# Patient Record
Sex: Male | Born: 1985 | Race: Black or African American | Hispanic: No | Marital: Single | Smoking: Current every day smoker
Health system: Southern US, Community
[De-identification: ages and names within clinical notes are randomized; demographics above are authoritative.]

## PROBLEM LIST (undated history)

## (undated) DIAGNOSIS — F32A Depression, unspecified: Secondary | ICD-10-CM

## (undated) HISTORY — DX: Depression, unspecified: F32.A

---

## 2011-03-23 ENCOUNTER — Emergency Department: Payer: Self-pay | Admitting: Emergency Medicine

## 2012-02-07 ENCOUNTER — Emergency Department: Payer: Self-pay | Admitting: Emergency Medicine

## 2013-10-14 ENCOUNTER — Emergency Department: Payer: Self-pay | Admitting: Emergency Medicine

## 2013-10-14 LAB — URINALYSIS, COMPLETE
Bacteria: NONE SEEN
Bilirubin,UR: NEGATIVE
Glucose,UR: NEGATIVE mg/dL (ref 0–75)
Ketone: NEGATIVE
Leukocyte Esterase: NEGATIVE
Nitrite: NEGATIVE
Ph: 7 (ref 4.5–8.0)
Squamous Epithelial: 1
WBC UR: 8 /HPF (ref 0–5)

## 2013-10-14 LAB — BASIC METABOLIC PANEL
BUN: 11 mg/dL (ref 7–18)
Calcium, Total: 9.1 mg/dL (ref 8.5–10.1)
Chloride: 103 mmol/L (ref 98–107)
Co2: 25 mmol/L (ref 21–32)
EGFR (African American): >60
EGFR (Non-African Amer.): >60
Glucose: 107 mg/dL — ABNORMAL HIGH (ref 65–99)
Osmolality: 270 (ref 275–301)
Potassium: 4.1 mmol/L (ref 3.5–5.1)
Sodium: 135 mmol/L — ABNORMAL LOW (ref 136–145)

## 2013-10-14 LAB — CBC
MCH: 29.8 pg (ref 26.0–34.0)
MCHC: 33.9 g/dL (ref 32.0–36.0)
Platelet: 258 10*3/uL (ref 150–440)
RBC: 5.19 10*6/uL (ref 4.40–5.90)
RDW: 13 % (ref 11.5–14.5)
WBC: 13.3 10*3/uL — ABNORMAL HIGH (ref 3.8–10.6)

## 2013-10-17 ENCOUNTER — Emergency Department: Payer: Self-pay | Admitting: Emergency Medicine

## 2014-06-21 LAB — CBC WITH DIFFERENTIAL/PLATELET
BASOS ABS: 0 10*3/uL (ref 0.0–0.1)
BASOS PCT: 0.3 %
Eosinophil #: 0.2 10*3/uL (ref 0.0–0.7)
Eosinophil %: 1.1 %
HCT: 44.2 % (ref 40.0–52.0)
HGB: 14.3 g/dL (ref 13.0–18.0)
LYMPHS ABS: 2.8 10*3/uL (ref 1.0–3.6)
Lymphocyte %: 18.9 %
MCH: 28.4 pg (ref 26.0–34.0)
MCHC: 32.4 g/dL (ref 32.0–36.0)
MCV: 88 fL (ref 80–100)
MONO ABS: 1.2 x10 3/mm — AB (ref 0.2–1.0)
MONOS PCT: 7.9 %
Neutrophil #: 10.8 10*3/uL — ABNORMAL HIGH (ref 1.4–6.5)
Neutrophil %: 71.8 %
PLATELETS: 245 10*3/uL (ref 150–440)
RBC: 5.03 10*6/uL (ref 4.40–5.90)
RDW: 12.7 % (ref 11.5–14.5)
WBC: 15.1 10*3/uL — AB (ref 3.8–10.6)

## 2014-06-21 LAB — BASIC METABOLIC PANEL
ANION GAP: 6 — AB (ref 7–16)
BUN: 6 mg/dL — AB (ref 7–18)
CALCIUM: 8.8 mg/dL (ref 8.5–10.1)
CHLORIDE: 101 mmol/L (ref 98–107)
Co2: 28 mmol/L (ref 21–32)
Creatinine: 0.98 mg/dL (ref 0.60–1.30)
EGFR (African American): >60
Glucose: 93 mg/dL (ref 65–99)
Osmolality: 267 (ref 275–301)
POTASSIUM: 3.5 mmol/L (ref 3.5–5.1)
Sodium: 135 mmol/L — ABNORMAL LOW (ref 136–145)

## 2014-06-22 ENCOUNTER — Inpatient Hospital Stay: Payer: Self-pay | Admitting: Internal Medicine

## 2014-06-22 LAB — VANCOMYCIN, TROUGH: VANCOMYCIN, TROUGH: 7 ug/mL — AB (ref 10–20)

## 2014-06-23 LAB — BASIC METABOLIC PANEL
ANION GAP: 4 — AB (ref 7–16)
BUN: 5 mg/dL — ABNORMAL LOW (ref 7–18)
CHLORIDE: 105 mmol/L (ref 98–107)
CO2: 28 mmol/L (ref 21–32)
Calcium, Total: 8.8 mg/dL (ref 8.5–10.1)
Creatinine: 0.77 mg/dL (ref 0.60–1.30)
EGFR (Non-African Amer.): >60
Glucose: 91 mg/dL (ref 65–99)
OSMOLALITY: 271 (ref 275–301)
Potassium: 4 mmol/L (ref 3.5–5.1)
Sodium: 137 mmol/L (ref 136–145)

## 2014-06-23 LAB — CBC WITH DIFFERENTIAL/PLATELET
BASOS PCT: 0.4 %
Basophil #: 0.1 10*3/uL (ref 0.0–0.1)
EOS ABS: 0.3 10*3/uL (ref 0.0–0.7)
Eosinophil %: 2.3 %
HCT: 40.4 % (ref 40.0–52.0)
HGB: 13.6 g/dL (ref 13.0–18.0)
LYMPHS PCT: 22.6 %
Lymphocyte #: 2.7 10*3/uL (ref 1.0–3.6)
MCH: 29.7 pg (ref 26.0–34.0)
MCHC: 33.5 g/dL (ref 32.0–36.0)
MCV: 89 fL (ref 80–100)
MONOS PCT: 8.1 %
Monocyte #: 1 x10 3/mm (ref 0.2–1.0)
NEUTROS PCT: 66.6 %
Neutrophil #: 8 10*3/uL — ABNORMAL HIGH (ref 1.4–6.5)
Platelet: 261 10*3/uL (ref 150–440)
RBC: 4.56 10*6/uL (ref 4.40–5.90)
RDW: 12.5 % (ref 11.5–14.5)
WBC: 12 10*3/uL — ABNORMAL HIGH (ref 3.8–10.6)

## 2014-06-26 LAB — CULTURE, BLOOD (SINGLE)

## 2015-04-21 NOTE — H&P (Signed)
PATIENT NAME:  Manuel Curtis, LASKI MR#:  [id number] DATE OF BIRTH:  16-Oct-1986  DATE OF ADMISSION:  06/22/2014  REFERRING PHYSICIAN:  Enedina Finner. Manson Passey, MD.  PRIMARY CARE PHYSICIAN:  None.   CHIEF COMPLAINTS:  Left facial swelling, erythema, and tenderness.   HISTORY OF PRESENT ILLNESS:  This is a 29 year old male without significant past medical history who presents with above-mentioned complaints. Reports over the last 48 hours initially he had an ingrown hair which he pulled, but then reports that the area started to become infected, more swollen, tender, and red which prompted him to come to ED today. Upon presentation to ED, the patient had fever at 99.7, was tachycardic at 106, and had leukocytosis at 15,000. The patient had CT neck with contrast, which she did show symmetric swelling with extensive inflammatory stranding throughout the left neck compatible with cellulitis and ill-defined phlegmonous changes within the subcutaneous fat of the left face without frank abscess. Given these symptoms, the patient was requested to admit to the hospitalist service. The patient denies any dysphagia, any problem with swallowing, any problem with speech.   PAST MEDICAL HISTORY:  None.   PAST SURGICAL HISTORY:  None.   SOCIAL HISTORY:  The patient smokes 6 cigarettes per day. No alcohol. No illicit drug use.   FAMILY HISTORY: Denies any family history of hypertension or diabetes.   ALLERGIES: NO KNOWN DRUG ALLERGIES.   HOME MEDICATIONS:  None.   REVIEW OF SYSTEMS:  CONSTITUTIONAL:  Reports mild fever. Denies weakness, weight gain, weight loss.  EYES:  Denies blurry vision, double vision, inflammation, glaucoma.  ENT:  Denies tinnitus, ear pain, hearing loss, epistaxis or discharge.  RESPIRATORY:  Denies cough, wheezing, hemoptysis.  CARDIOVASCULAR:  Denies chest pain, edema, palpitation.  GASTROINTESTINAL:  Denies nausea, vomiting, diarrhea, abdominal pain.  GENITOURINARY:  Denies dysuria,  hematuria.  ENDOCRINE:  Denies polyuria, polydipsia, heat or cold intolerance.  HEMATOLOGY:  Denies anemia, easy bruising, bleeding, diathesis.  INTEGUMENT:  Denies acne, rash, or skin issues.  MUSCULOSKELETAL:  Denies any swelling, gout, cramps, arthritis.  NEUROLOGIC:  Denies tremors, vertigo, ataxia, dementia.  PSYCHIATRIC:  Denies anxiety, insomnia, or depression.   PHYSICAL EXAMINATION:  VITAL SIGNS:  Temperature 99.7, pulse 106, respiratory rate 18, blood pressure 140/72, saturating 97% on room air.  GENERAL:  Well-nourished young male, looks comfortable, in no apparent distress.  HEENT:  The patient has left facial swelling, erythema, and tenderness with lymphadenopathy. Throat has no erythema noticed in the back of the throat. NECK:  As mentioned above. No carotid bruits. Trachea is midline.  CHEST:  Good air entry bilaterally. No wheezing, rales, rhonchi.  CARDIOVASCULAR:  S1, S2 heard. No rubs, murmurs, or gallops.  ABDOMEN:  Soft, nontender, nondistended. Bowel sounds present.  EXTREMITIES:  No edema. No clubbing. No cyanosis. Pedal and radial pulses felt bilaterally.  PSYCHIATRIC:  Appropriate affect. Awake, alert x3. Intact judgment and insight.  NEUROLOGIC:  Cranial nerves grossly intact. Motor 5/5. No focal deficits.  MUSCULOSKELETAL:  No joint effusion or erythema.   PERTINENT LABORATORY DATA: Glucose 93, BUN 6, creatinine 0.98, sodium 135, potassium 3.5, chloride 101, CO2 of 28. White blood cell 15.1, hemoglobin repeat 14.3, hematocrit 44.2, platelets 245,000.   IMAGING STUDIES: CT neck with IV contrast showing symmetric swelling with extensive inflammatory stranding throughout the left neck, most compatible with cellulitis and ill-defined phlegmonous changes within the subcutaneous fat of the left face without frank abscess. No (Dictation Anomaly) MISSING TEXT definite drainable fluid collection identified. (Dictation Anomaly)  MISSING TEXT level 1 and 2 adenopathy, likely  reactive in nature and (Dictation Anomaly) MISSING TEXT maxillary sinus disease.   ASSESSMENT AND PLAN:  1. Sepsis: The patient is septic as he is tachycardic, febrile with leukocytosis. His source is most likely his face/neck cellulitis. The patient will be started on IV antibiotics, vancomycin and Zosyn. We will follow on the blood cultures.  2. Tobacco abuse: The patient was counseled; at this point does not want a nicotine patch.  3. Deep vein thrombosis prophylaxis: Subcutaneous heparin.  4. Code status: Full code.   Total time spent on admission and patient care 45 minutes.     ____________________________ Starleen Armsawood S. Elgergawy, MD [phone] D: 06/22/2014 02:29:25 ET T: 06/22/2014 08:14:36 ET JOB#: [id number]  cc: Starleen Armsawood S. Elgergawy, MD, <Dictator> DAWOOD Teena IraniS ELGERGAWY MD ELECTRONICALLY SIGNED 06/23/2014 20:32

## 2015-04-21 NOTE — Discharge Summary (Signed)
PATIENT NAME:  Manuel Curtis, Manuel Curtis MR#:  [id number] DATE OF BIRTH:  1986/11/08  DATE OF ADMISSION:  06/22/2014 DATE OF DISCHARGE:  06/23/2014  ADMISSION DIAGNOSIS: Facial cellulitis.   DISCHARGE DIAGNOSIS: Facial cellulitis.   IMAGING: The patient had CT scan which showed no evidence of abscess, but he does have facial cellulitis.  Blood cultures negative to date.   White blood cells 12, hemoglobin 13.6, hematocrit 41, platelets are 261,000.   Sodium 137, potassium 4.0, chloride 105, bicarbonate 28, BUN 5, creatinine 0.77, glucose 91.   CONSULTATIONS: None.   HOSPITAL COURSE: A 29 year old male who presented with facial cellulitis. further details, please refer to the H and P.   1.  Facial cellulitis. The patient had a CT scan in the ER, which showed no evidence of abscess. He had a small little boil, probably from ingrown hair, the etiology of the cellulitis. This actually was draining.  Cellulitis has improved.  No evidence of any abscess, and patient was on broad-spectrum antibiotics and will be discharged with Bactrim.   2.  Tobacco dependence. The patient was encouraged to stop smoking and he was counseled.   DISCHARGE MEDICATIONS: Bactrim 1 tablet p.o. b.i.d. x 8 days.   DISCHARGE DIET: Regular.   DISCHARGE ACTIVITY: As tolerated.   DISCHARGE FOLLOW UP: The patient was referred to [facility].   TIME SPENT: 35 minutes.   The patient is stable for discharge.    ____________________________ Sital P. Juliene PinaMody, MD spm:ts D: 06/23/2014 13:25:26 ET T: 06/23/2014 18:39:59 ET JOB#: [id number]  cc: Sital P. Juliene PinaMody, MD, <Dictator> [facility] SITAL P MODY MD ELECTRONICALLY SIGNED 06/24/2014 11:49

## 2015-06-05 ENCOUNTER — Encounter: Payer: Self-pay | Admitting: *Deleted

## 2015-06-05 ENCOUNTER — Emergency Department
Admission: EM | Admit: 2015-06-05 | Discharge: 2015-06-05 | Disposition: A | Discharge status: Home / Self Care | Payer: Self-pay | Attending: Emergency Medicine | Admitting: Emergency Medicine

## 2015-06-05 DIAGNOSIS — Y9289 Other specified places as the place of occurrence of the external cause: Secondary | ICD-10-CM | POA: Insufficient documentation

## 2015-06-05 DIAGNOSIS — Z720 Tobacco use: Secondary | ICD-10-CM | POA: Insufficient documentation

## 2015-06-05 DIAGNOSIS — Y998 Other external cause status: Secondary | ICD-10-CM | POA: Insufficient documentation

## 2015-06-05 DIAGNOSIS — S0501XA Injury of conjunctiva and corneal abrasion without foreign body, right eye, initial encounter: Secondary | ICD-10-CM

## 2015-06-05 DIAGNOSIS — Y9389 Activity, other specified: Secondary | ICD-10-CM | POA: Insufficient documentation

## 2015-06-05 DIAGNOSIS — X58XXXA Exposure to other specified factors, initial encounter: Secondary | ICD-10-CM | POA: Insufficient documentation

## 2015-06-05 MED ORDER — ERYTHROMYCIN 5 MG/GM OP OINT: 1.0000 "application " | TOPICAL_OINTMENT | Freq: Four times a day (QID) | OPHTHALMIC | Status: DC

## 2015-06-05 MED ORDER — TETRACAINE HCL 0.5 % OP SOLN
OPHTHALMIC | Status: AC
  Administered 2015-06-05: 2 [drp] via OPHTHALMIC
  Filled 2015-06-05: qty 2

## 2015-06-05 MED ORDER — FLUORESCEIN SODIUM 1 MG OP STRP
ORAL_STRIP | OPHTHALMIC | Status: AC
  Administered 2015-06-05: 1 via OPHTHALMIC
  Filled 2015-06-05: qty 1

## 2015-06-05 MED ORDER — TETRACAINE HCL 0.5 % OP SOLN
2.0000 [drp] | Freq: Once | OPHTHALMIC | Status: AC
  Administered 2015-06-05: 2 [drp] via OPHTHALMIC

## 2015-06-05 MED ORDER — FLUORESCEIN SODIUM 1 MG OP STRP
1.0000 | ORAL_STRIP | Freq: Once | OPHTHALMIC | Status: AC
  Administered 2015-06-05: 1 via OPHTHALMIC

## 2015-06-05 NOTE — ED Provider Notes (Signed)
[facility] Emergency Department Provider Note  ____________________________________________  Time seen: Approximately 1:31 PM  I have reviewed the triage vital signs and the nursing notes.   HISTORY  Chief Complaint Conjunctivitis   HPI Manuel Curtis is a 29 y.o. male presents to the ER for a complaint of right eye redness. Patient denies pain but states is mildly irritated feeling. Patient states that his right eye was somewhat red Friday when he got up and then noticed that it gradually progressed as the day went on Friday. Denies vision changes, states vision in right eye is normal for him without his contacts. Patient states that he normally wears contacts in both eyes but does not have contact in right eye since redness has started. Patient states that when he put his contact in his right eye Friday morning he felt like he may have scratched his right eye. States intermittently feels like something is in his eye. Denies left eye complaints.   Denies foreign bodies, chemicals, pinkeye exposure or other contacts. Denies other complaints.   History reviewed. No pertinent past medical history.  There are no active problems to display for this patient.   History reviewed. No pertinent past surgical history.  No current outpatient prescriptions on file.  Allergies Review of patient's allergies indicates no known allergies.  History reviewed. No pertinent family history.  Social History History  Substance Use Topics  . Smoking status: Current Every Day Smoker -- 0.50 packs/day  . Smokeless tobacco: Not on file  . Alcohol Use: No    Review of Systems Constitutional: No fever/chills Eyes: Right eye redness. ENT: No sore throat. Cardiovascular: Denies chest pain. Respiratory: Denies shortness of breath. Gastrointestinal: No abdominal pain.  No nausea, no vomiting.  No diarrhea.  No constipation. Genitourinary: Negative for  dysuria. Musculoskeletal: Negative for back pain. Skin: Negative for rash. Neurological: Negative for headaches, focal weakness or numbness.  10-point ROS otherwise negative.  ____________________________________________   PHYSICAL EXAM:  VITAL SIGNS: ED Triage Vitals  Enc Vitals Group     BP 06/05/15 1247 112/70 mmHg     Pulse Rate 06/05/15 1247 70     Resp 06/05/15 1247 20     Temp 06/05/15 1247 98.3 F (36.8 C)     Temp Source 06/05/15 1247 Oral     SpO2 06/05/15 1247 98 %     Weight 06/05/15 1247 160 lb (72.576 kg)     Height 06/05/15 1247 6' (1.829 m)     Head Cir --      Peak Flow --      Pain Score --      Pain Loc --      Pain Edu? --      Excl. in [place]? --     Constitutional: Alert and oriented. Well appearing and in no acute distress. Eyes: Left Conjunctiva normal. Right eye with mild to mod injection. Bedside posterior segments appear normal. Right eye examined with anesthesia use with tetracaine 2 drops. Fluorescein dye also use. Very small right corneal abrasion found at approximately 5:00 o'clock. No other abnormalities noted. PERRL. EOMI. No pain with EOMs.  Head: Atraumatic. Ears: no erythema, normal TMs Nose: No congestion/rhinnorhea. Mouth/Throat: Mucous membranes are moist.  Oropharynx non-erythematous. Neck: No stridor.  No cervical spine tenderness to palpation. Hematological/Lymphatic/Immunilogical: No cervical lymphadenopathy. Cardiovascular: Normal rate, regular rhythm. Grossly normal heart sounds.  Good peripheral circulation. Respiratory: Normal respiratory effort.  No retractions. Lungs CTAB. Gastrointestinal: Soft and nontender. No distention.  Musculoskeletal: No lower extremity tenderness nor edema.  No joint effusions. Neurologic:  Normal speech and language. No gross focal neurologic deficits are appreciated. Speech is normal. No gait instability. Skin:  Skin is warm, dry and intact. No rash noted. Psychiatric: Mood and affect are normal.  Speech and behavior are normal.   PROCEDURES  Procedure(s) performed:  Right eye examined with Wood's lamp. Anesthesia with 2 drops of tetracaine. Small corneal abrasion at 5:00 found. Patient tolerated well. ____________________________________________   INITIAL IMPRESSION / ASSESSMENT AND PLAN / ED COURSE  Pertinent labs & imaging results that were available during my care of the patient were reviewed by me and considered in my medical decision making (see chart for details).  No acute distress. Very well-appearing patient. Presents to the ER for complaint of 3-4 days of right eye redness. Denies pain. Denies vision changes. States he believes he may have scratched his eye when placed in an contacts. Has not used contacts since. Small right corneal abrasion noted on exam. Will treat patient with erythromycin ointment and follow up with ophthalmology. Discussed do not use contacts until fully resolved. Discussed strict follow-up and return parameters. Patient agreed to plan. ____________________________________________   FINAL CLINICAL IMPRESSION(S) / ED DIAGNOSES  Final diagnoses:  Corneal abrasion, right, initial encounter      Renford Dills, NP 06/05/15 1433  Myrna Blazer, MD 06/05/15 [phone]

## 2015-06-05 NOTE — ED Notes (Signed)
Pt reports right eye redness with itching starting Friday

## 2015-06-05 NOTE — Discharge Instructions (Signed)
Use medication as prescribed. No contacts in right eye until completed treatment.  Follow-up with ophthalmology this week as discussed. See above.  Return to ER for new or worsening concerns.  Corneal Abrasion The cornea is the clear covering at the front and center of the eye. When looking at the colored portion of the eye (iris), you are looking through the cornea. This very thin tissue is made up of many layers. The surface layer is a single layer of cells (corneal epithelium) and is one of the most sensitive tissues in the body. If a scratch or injury causes the corneal epithelium to come off, it is called a corneal abrasion. If the injury extends to the tissues below the epithelium, the condition is called a corneal ulcer. CAUSES   Scratches.  Trauma.  Foreign body in the eye. Some people have recurrences of abrasions in the area of the original injury even after it has healed (recurrent erosion syndrome). Recurrent erosion syndrome generally improves and goes away with time. SYMPTOMS   Eye pain.  Difficulty or inability to keep the injured eye open.  The eye becomes very sensitive to light.  Recurrent erosions tend to happen suddenly, first thing in the morning, usually after waking up and opening the eye. DIAGNOSIS  Your health care provider can diagnose a corneal abrasion during an eye exam. Dye is usually placed in the eye using a drop or a small paper strip moistened by your tears. When the eye is examined with a special light, the abrasion shows up clearly because of the dye. TREATMENT   Small abrasions may be treated with antibiotic drops or ointment alone.  A pressure patch may be put over the eye. If this is done, follow your doctor's instructions for when to remove the patch. Do not drive or use machines while the eye patch is on. Judging distances is hard to do with a patch on. If the abrasion becomes infected and spreads to the deeper tissues of the cornea, a corneal  ulcer can result. This is serious because it can cause corneal scarring. Corneal scars interfere with light passing through the cornea and cause a loss of vision in the involved eye. HOME CARE INSTRUCTIONS  Use medicine or ointment as directed. Only take over-the-counter or prescription medicines for pain, discomfort, or fever as directed by your health care provider.  Do not drive or operate machinery if your eye is patched. Your ability to judge distances is impaired.  If your health care provider has given you a follow-up appointment, it is very important to keep that appointment. Not keeping the appointment could result in a severe eye infection or permanent loss of vision. If there is any problem keeping the appointment, let your health care provider know. SEEK MEDICAL CARE IF:   You have pain, light sensitivity, and a scratchy feeling in one eye or both eyes.  Your pressure patch keeps loosening up, and you can blink your eye under the patch after treatment.  Any kind of discharge develops from the eye after treatment or if the lids stick together in the morning.  You have the same symptoms in the morning as you did with the original abrasion days, weeks, or months after the abrasion healed. MAKE SURE YOU:   Understand these instructions.  Will watch your condition.  Will get help right away if you are not doing well or get worse. Document Released: 12/12/2000 Document Revised: 12/20/2013 Document Reviewed: 08/22/2013 [facility] Patient Information 2015 [place], [place].  This information is not intended to replace advice given to you by your health care provider. Make sure you discuss any questions you have with your health care provider. ° °

## 2019-10-23 ENCOUNTER — Encounter: Payer: Self-pay | Admitting: Emergency Medicine

## 2019-10-23 ENCOUNTER — Emergency Department
Admission: EM | Admit: 2019-10-23 | Discharge: 2019-10-24 | Disposition: A | Discharge status: Home / Self Care | Payer: No Typology Code available for payment source | Attending: Emergency Medicine | Admitting: Emergency Medicine

## 2019-10-23 ENCOUNTER — Other Ambulatory Visit: Payer: Self-pay

## 2019-10-23 ENCOUNTER — Emergency Department: Payer: No Typology Code available for payment source

## 2019-10-23 DIAGNOSIS — Y9389 Activity, other specified: Secondary | ICD-10-CM | POA: Diagnosis not present

## 2019-10-23 DIAGNOSIS — Y999 Unspecified external cause status: Secondary | ICD-10-CM | POA: Insufficient documentation

## 2019-10-23 DIAGNOSIS — Y92410 Unspecified street and highway as the place of occurrence of the external cause: Secondary | ICD-10-CM | POA: Insufficient documentation

## 2019-10-23 DIAGNOSIS — M546 Pain in thoracic spine: Secondary | ICD-10-CM | POA: Insufficient documentation

## 2019-10-23 DIAGNOSIS — M542 Cervicalgia: Secondary | ICD-10-CM | POA: Insufficient documentation

## 2019-10-23 DIAGNOSIS — R0789 Other chest pain: Secondary | ICD-10-CM | POA: Insufficient documentation

## 2019-10-23 DIAGNOSIS — F17210 Nicotine dependence, cigarettes, uncomplicated: Secondary | ICD-10-CM | POA: Insufficient documentation

## 2019-10-23 DIAGNOSIS — M25511 Pain in right shoulder: Secondary | ICD-10-CM | POA: Diagnosis not present

## 2019-10-23 DIAGNOSIS — M25562 Pain in left knee: Secondary | ICD-10-CM | POA: Diagnosis not present
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
## 2019-10-24 ENCOUNTER — Emergency Department: Payer: No Typology Code available for payment source
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
## 2022-03-04 ENCOUNTER — Encounter: Payer: Self-pay | Admitting: Family Medicine

## 2022-03-04 ENCOUNTER — Ambulatory Visit: Payer: Self-pay | Admitting: Family Medicine

## 2022-03-04 ENCOUNTER — Other Ambulatory Visit: Payer: Self-pay

## 2022-03-04 DIAGNOSIS — Z113 Encounter for screening for infections with a predominantly sexual mode of transmission: Secondary | ICD-10-CM

## 2022-03-04 DIAGNOSIS — Z202 Contact with and (suspected) exposure to infections with a predominantly sexual mode of transmission: Secondary | ICD-10-CM

## 2022-03-04 MED ORDER — METRONIDAZOLE 500 MG PO TABS: 500.0000 mg | ORAL_TABLET | Freq: Two times a day (BID) | ORAL | 0 refills | Status: AC

## 2022-03-04 NOTE — Progress Notes (Signed)
[facility] Department ?STI clinic/screening visit ? ?Subjective:  ?Manuel Curtis is a 36 y.o. male being seen today for an STI screening visit. The patient reports they do not have symptoms.   ? ?Patient has the following medical conditions:  There are no problems to display for this patient. ? ? ? ?Chief Complaint  ?Patient presents with  ? Exposure to STD  ?  screening  ? ? ?HPI ? ?Patient reports here for screening  ? ?Does the patient or their partner desires a pregnancy in the next year? No ? ?Screening for MPX risk: ?Does the patient have an unexplained rash? No ?Is the patient MSM? No ?Does the patient endorse multiple sex partners or anonymous sex partners? Yes ?Did the patient have close or sexual contact with a person diagnosed with MPX? No ?Has the patient traveled outside the [place] where MPX is endemic? No ?Is there a high clinical suspicion for MPX-- evidenced by one of the following No ? -Unlikely to be chickenpox ? -Lymphadenopathy ? -Rash that present in same phase of evolution on any given body part ? ? ?See flowsheet for further details and programmatic requirements.  ? ? ?The following portions of the patient's history were reviewed and updated as appropriate: allergies, current medications, past medical history, past social history, past surgical history and problem list. ? ?Objective:  ?There were no vitals filed for this visit. ? ?Physical Exam ?Constitutional:   ?   Appearance: Normal appearance.  ?HENT:  ?   Head: Normocephalic.  ?   Mouth/Throat:  ?   Mouth: Mucous membranes are moist.  ?   Pharynx: Oropharynx is clear. No oropharyngeal exudate.  ?Pulmonary:  ?   Effort: Pulmonary effort is normal.  ?Genitourinary: ?   Comments: Deferred, pt declined  ?Musculoskeletal:  ?   Cervical back: Normal range of motion and neck supple.  ?Lymphadenopathy:  ?   Cervical: No cervical adenopathy.  ?Skin: ?   General: Skin is warm and dry.  ?   Findings: No bruising, erythema, lesion or rash.   ?Neurological:  ?   Mental Status: He is alert and oriented to person, place, and time.  ?Psychiatric:     ?   Mood and Affect: Mood normal.     ?   Behavior: Behavior normal.  ? ? ? ? ?Assessment and Plan:  ?Manuel Curtis is a 36 y.o. male presenting to the [facility] Department for STI screening ? ?1. Screening examination for venereal disease ?Patient does not have STI symptoms ?Patient declined all screenings including  gram stain, oral, urine, rectal CT/GC and bloodwork for HIV/RPR.  ?Patient meets criteria for HepB screening? Yes. Ordered? No - declined ?Patient meets criteria for HepC screening? Yes. Ordered? No - declined ?Recommended condom use with all sex ?Discussed importance of condom use for STI prevent ? ?Treat gram stain per standing order ?Discussed time line for State Lab results and that patient will be called with positive results and encouraged patient to call if he had not heard in 2 weeks ?Recommended returning for continued or worsening symptoms.  ? ?2. Exposure to trichomonas ?Pt treated for exposure  ?- metroNIDAZOLE (FLAGYL) 500 MG tablet; Take 1 tablet (500 mg total) by mouth 2 (two) times daily for 7 days.  Dispense: 14 tablet; Refill: 0 ? ? ? ? ?No follow-ups on file. ? ?Future Appointments  ?Date Time Provider Department Center  ?03/12/2022 10:00 AM Jerrol Banana, MD [facility]  ? ? ?  Wendi Snipes, FNP ?

## 2022-03-04 NOTE — Progress Notes (Signed)
Patient treated per SO as contact to Trich.Jenetta Downer, RN  ?

## 2022-03-12 ENCOUNTER — Ambulatory Visit (INDEPENDENT_AMBULATORY_CARE_PROVIDER_SITE_OTHER): Payer: Self-pay | Admitting: Family Medicine

## 2022-03-12 ENCOUNTER — Encounter: Payer: Self-pay | Admitting: Family Medicine

## 2022-03-12 ENCOUNTER — Other Ambulatory Visit: Payer: Self-pay

## 2022-03-12 VITALS — BP 124/82 | HR 71 | Ht 74.0 in | Wt 170.8 lb

## 2022-03-12 DIAGNOSIS — F329 Major depressive disorder, single episode, unspecified: Secondary | ICD-10-CM

## 2022-03-12 MED ORDER — HYDROXYZINE PAMOATE 25 MG PO CAPS: 25.0000 mg | ORAL_CAPSULE | Freq: Four times a day (QID) | ORAL | 1 refills | Status: DC | PRN

## 2022-03-12 MED ORDER — SERTRALINE HCL 50 MG PO TABS: 50.0000 mg | ORAL_TABLET | Freq: Every day | ORAL | 3 refills | Status: DC

## 2022-03-12 NOTE — Patient Instructions (Signed)
-   Start sertraline daily ?- Can dose hydroxyzine every 6 hours as-needed for anxiety episodes ?- Review information provided ?- Return in 4 weeks for physical ?

## 2022-03-14 DIAGNOSIS — F329 Major depressive disorder, single episode, unspecified: Secondary | ICD-10-CM | POA: Insufficient documentation

## 2022-03-14 NOTE — Assessment & Plan Note (Signed)
Patient with several week history of severe depression and anxiety in the setting of life stressors (relationship issues).  He has been attempting regular exercise with modest benefit, has noted sleep disruption, denies any SI or HI. ? ?We discussed both pharmacologic and nonpharmacologic treatment strategies, is amenable to sertraline 50 mg daily, plan for close follow-up to reassess response and need for further titration. ?

## 2022-03-14 NOTE — Progress Notes (Signed)
?  ? ?  Primary Care / Sports Medicine Office Visit ? ?Patient Information:  ?Patient ID: Manuel Curtis, male DOB: 1986/03/25 Age: 36 y.o. MRN: [id number]  ? ?Manuel Curtis is a pleasant 36 y.o. male presenting with the following: ? ?Chief Complaint  ?Patient presents with  ? Establish Care  ?  Depression/Anxiety talk  ? ? ?Vitals:  ? 03/12/22 1007  ?BP: 124/82  ?Pulse: 71  ?SpO2: 99%  ? ?Vitals:  ? 03/12/22 1007  ?Weight: 170 lb 12.8 oz (77.5 kg)  ?Height: 6\' 2"  (1.88 m)  ? ?Body mass index is 21.93 kg/m?. ? ?No results found.  ? ?Independent interpretation of notes and tests performed by another provider:  ? ?None ? ?Procedures performed:  ? ?None ? ?Pertinent History, Exam, Impression, and Recommendations:  ? ?Current episode of major depressive disorder without prior episode ?Patient with several week history of severe depression and anxiety in the setting of life stressors (relationship issues).  He has been attempting regular exercise with modest benefit, has noted sleep disruption, denies any SI or HI. ? ?We discussed both pharmacologic and nonpharmacologic treatment strategies, is amenable to sertraline 50 mg daily, plan for close follow-up to reassess response and need for further titration.  ? ?Orders & Medications ?Meds ordered this encounter  ?Medications  ? sertraline (ZOLOFT) 50 MG tablet  ?  Sig: Take 1 tablet (50 mg total) by mouth daily.  ?  Dispense:  30 tablet  ?  Refill:  3  ? hydrOXYzine (VISTARIL) 25 MG capsule  ?  Sig: Take 1 capsule (25 mg total) by mouth every 6 (six) hours as needed for anxiety.  ?  Dispense:  30 capsule  ?  Refill:  1  ? ?No orders of the defined types were placed in this encounter. ?  ? ?Return in about 4 weeks (around 04/09/2022) for Physical.  ?  ? ?06/09/2022, MD ? ? Primary Care Sports Medicine ?[facility] ?[facility]  ? ?

## 2022-04-09 ENCOUNTER — Ambulatory Visit (INDEPENDENT_AMBULATORY_CARE_PROVIDER_SITE_OTHER): Payer: Self-pay | Admitting: Family Medicine

## 2022-04-09 ENCOUNTER — Encounter: Payer: Self-pay | Admitting: Family Medicine

## 2022-04-09 VITALS — BP 118/70 | HR 76 | Ht 72.0 in | Wt 174.4 lb

## 2022-04-09 DIAGNOSIS — Z0000 Encounter for general adult medical examination without abnormal findings: Secondary | ICD-10-CM

## 2022-04-09 DIAGNOSIS — Z1159 Encounter for screening for other viral diseases: Secondary | ICD-10-CM

## 2022-04-09 DIAGNOSIS — Z13220 Encounter for screening for lipoid disorders: Secondary | ICD-10-CM

## 2022-04-09 DIAGNOSIS — Z114 Encounter for screening for human immunodeficiency virus [HIV]: Secondary | ICD-10-CM

## 2022-04-09 DIAGNOSIS — R7989 Other specified abnormal findings of blood chemistry: Secondary | ICD-10-CM

## 2022-04-09 DIAGNOSIS — F329 Major depressive disorder, single episode, unspecified: Secondary | ICD-10-CM

## 2022-04-09 MED ORDER — SERTRALINE HCL 50 MG PO TABS: 50.0000 mg | ORAL_TABLET | Freq: Every day | ORAL | 3 refills | Status: DC

## 2022-04-09 NOTE — Assessment & Plan Note (Signed)
Patient reports subjective interval improvement, did not initiate medication as he found nonpharmacologic methods to improve his wellbeing.  That being said, he continues to report symptoms, racing thoughts, anxiety episodes, is amenable to both starting sertraline as previously prescribed as well as establishing with psychiatry.  A referral was placed in that regard. ?

## 2022-04-09 NOTE — Assessment & Plan Note (Signed)
Annual examination completed, risk stratification labs ordered, anticipatory guidance provided.  We will follow labs once resulted.  Patient due for Tdap, deferred. ?

## 2022-04-09 NOTE — Patient Instructions (Signed)
-   Obtain fasting labs with orders provided (can have water or black coffee but otherwise no food or drink x 8 hours before labs) °- Review information provided °- Attend eye doctor annually, dentist every 6 months, work towards or maintain 30 minutes of moderate intensity physical activity at least 5 days per week, and consume a balanced diet °- Return in 1 year for physical °- Contact us for any questions between now and then °

## 2022-04-09 NOTE — Progress Notes (Signed)
?  ? ?Annual Physical Exam Visit ? ?Patient Information:  ?Patient ID: Manuel Curtis, male DOB: 1986-06-29 Age: 36 y.o. MRN: [id number]  ? ?Subjective:  ? ?CC: Annual Physical Exam ? ?HPI:  ?Manuel Curtis is here for their annual physical. ? ?I reviewed the past medical history, family history, social history, surgical history, and allergies today and changes were made as necessary.  Please see the problem list section below for additional details. ? ?Past Medical History: ?Past Medical History:  ?Diagnosis Date  ? Depression   ? ?Past Surgical History: ?History reviewed. No pertinent surgical history. ?Family History: ?Family History  ?Problem Relation Age of Onset  ? Hypertension Mother   ? Heart disease Mother   ? Diabetes Mother   ? Cancer Father   ? COPD Paternal Grandmother   ? ?Allergies: ?No Known Allergies ?Health Maintenance: ?Health Maintenance  ?Topic Date Due  ? Hepatitis C Screening  Never done  ? TETANUS/TDAP  03/13/2023 (Originally 07/20/2006)  ? INFLUENZA VACCINE  07/29/2022  ? HIV Screening  Completed  ? HPV VACCINES  Aged Out  ? COVID-19 Vaccine  Discontinued  ?  ?HM Colonoscopy   ? ? This patient has no relevant Health Maintenance data.  ? ?  ? ?Medications: ?No current outpatient medications on file prior to visit.  ? ?No current facility-administered medications on file prior to visit.  ? ? ?Review of Systems: No headache, visual changes, nausea, vomiting, diarrhea, constipation, dizziness, abdominal pain, skin rash, fevers, chills, night sweats, swollen lymph nodes, weight loss, chest pain, body aches, joint swelling, muscle aches, shortness of breath, mood changes, visual or auditory hallucinations reported. ? ?Objective:  ? ?Vitals:  ? 04/09/22 1048  ?BP: 118/70  ?Pulse: 76  ?SpO2: 98%  ? ?Vitals:  ? 04/09/22 1048  ?Weight: 174 lb 6.4 oz (79.1 kg)  ?Height: 6' (1.829 m)  ? ?Body mass index is 23.65 kg/m?. ? ?General: Well Developed, well nourished, and in no acute distress.  ?Neuro:  Alert and oriented x3, extra-ocular muscles intact, sensation grossly intact. Cranial nerves II through XII are grossly intact, motor, sensory, and coordinative functions are intact. ?HEENT: Normocephalic, atraumatic, pupils equal round reactive to light, neck supple, no masses, no lymphadenopathy, thyroid nonpalpable. Oropharynx, nasopharynx, external ear canals are unremarkable. ?Skin: Warm and dry, no rashes noted.  ?Cardiac: Regular rate and rhythm, no murmurs rubs or gallops. No peripheral edema. Pulses symmetric. ?Respiratory: Clear to auscultation bilaterally. Not using accessory muscles, speaking in full sentences.  ?Abdominal: Soft, nontender, nondistended, positive bowel sounds, no masses, no organomegaly. ?Musculoskeletal: Shoulder, elbow, wrist, hip, knee, ankle stable, and with full range of motion. ? ?Impression and Recommendations:  ? ?The patient was counselled, risk factors were discussed, and anticipatory guidance given. ? ?Annual physical exam ?Annual examination completed, risk stratification labs ordered, anticipatory guidance provided.  We will follow labs once resulted.  Patient due for Tdap, deferred. ? ?Current episode of major depressive disorder without prior episode ?Patient reports subjective interval improvement, did not initiate medication as he found nonpharmacologic methods to improve his wellbeing.  That being said, he continues to report symptoms, racing thoughts, anxiety episodes, is amenable to both starting sertraline as previously prescribed as well as establishing with psychiatry.  A referral was placed in that regard. ? ?Orders & Medications ?Medications:  ?Meds ordered this encounter  ?Medications  ? sertraline (ZOLOFT) 50 MG tablet  ?  Sig: Take 1 tablet (50 mg total) by mouth daily.  ?  Dispense:  30 tablet  ?  Refill:  3  ? ?Orders Placed This Encounter  ?Procedures  ? Apo A1 + B + Ratio  ? CBC  ? Comprehensive metabolic panel  ? Hepatitis C antibody  ? VITAMIN D 25  Hydroxy (Vit-D Deficiency, Fractures)  ? TSH  ? Lipid panel  ? HIV Antibody (routine testing w rflx)  ? Ambulatory referral to Psychiatry  ?  ? ?Return in about 1 year (around 04/10/2023) for Annual Physical.  ? ? ?Jerrol Banana, MD ? ? Primary Care Sports Medicine ?[facility] ?[facility]  ? ?

## 2022-05-23 ENCOUNTER — Ambulatory Visit: Payer: Self-pay | Admitting: Psychiatry

## 2023-04-13 ENCOUNTER — Encounter: Payer: Self-pay | Admitting: Family Medicine

## 2023-04-14 ENCOUNTER — Encounter: Payer: Self-pay | Admitting: Family Medicine

## 2023-09-03 ENCOUNTER — Other Ambulatory Visit: Payer: Self-pay

## 2023-09-03 ENCOUNTER — Emergency Department (HOSPITAL_COMMUNITY)
Admission: EM | Admit: 2023-09-03 | Discharge: 2023-09-03 | Disposition: A | Discharge status: Home / Self Care | Payer: BC Managed Care – PPO | Attending: Student | Admitting: Student

## 2023-09-03 ENCOUNTER — Emergency Department (HOSPITAL_COMMUNITY): Payer: BC Managed Care – PPO

## 2023-09-03 DIAGNOSIS — R222 Localized swelling, mass and lump, trunk: Secondary | ICD-10-CM | POA: Insufficient documentation

## 2023-09-03 DIAGNOSIS — F17210 Nicotine dependence, cigarettes, uncomplicated: Secondary | ICD-10-CM | POA: Diagnosis not present

## 2023-09-03 NOTE — ED Notes (Signed)
Ultrasound at bedside

## 2023-09-03 NOTE — Discharge Instructions (Addendum)
You were seen in the Emergency Department for an evaluation of an abdominal mass.  An ultrasound was performed that shows a 7 mm subcutaneous mass that I have very low suspicion is cancerous in nature.  This will only require follow-up and monitoring in the outpatient setting.  Please call your PCP to discuss these findings and set up appropriate follow-up.

## 2023-09-03 NOTE — ED Notes (Signed)
Patient discharged by this RN. Discharge instructions reviewed, patient verbalized understanding with no additional questions. Patient ambulatory to lobby with significant other.

## 2023-09-03 NOTE — ED Triage Notes (Signed)
Pt. Stated, I have a little lump in my left side of my abdomen for a month

## 2023-09-03 NOTE — ED Provider Notes (Signed)
[place] EMERGENCY DEPARTMENT AT [facility] Provider Note  CSN: [id number] Arrival date & time: 09/03/23 4098  Chief Complaint(s) lump in abdomen  HPI Manuel Curtis is a 37 y.o. male who presents emergency department for evaluation of an abdominal wall mass.  Patient states that over the last 1 month he has noticed a small palpable nodule in the abdominal wall near the umbilicus on the left.  States that he feels intermittent pain in that area with heavy lifting or straining.  His primary concern is that his father had pancreatic cancer and he was worried that this may be a sign of pancreatic cancer.  He denies chest pain, shortness of breath, abdominal pain, nausea, vomiting, weight loss, night sweats or other systemic symptoms   Past Medical History Past Medical History:  Diagnosis Date   Depression    Patient Active Problem List   Diagnosis Date Noted   Annual physical exam 04/09/2022   Current episode of major depressive disorder without prior episode 03/14/2022   Home Medication(s) Prior to Admission medications   Medication Sig Start Date End Date Taking? Authorizing Provider  sertraline (ZOLOFT) 50 MG tablet Take 1 tablet (50 mg total) by mouth daily. 04/09/22   Jerrol Banana, MD                                                                                                                                    Past Surgical History No past surgical history on file. Family History Family History  Problem Relation Age of Onset   Hypertension Mother    Heart disease Mother    Diabetes Mother    Cancer Father    COPD Paternal Grandmother     Social History Social History   Tobacco Use   Smoking status: Every Day    Current packs/day: 0.50    Average packs/day: 0.5 packs/day for 10.0 years (5.0 ttl pk-yrs)    Types: Cigarettes   Smokeless tobacco: Never  Vaping Use   Vaping status: Every Day   Substances: Nicotine, Flavoring  Substance Use Topics    Alcohol use: No   Drug use: Yes    Types: Marijuana    Comment: daily   Allergies Patient has no known allergies.  Review of Systems Review of Systems  All other systems reviewed and are negative.   Physical Exam Vital Signs  I have reviewed the triage vital signs BP 114/80   Pulse 60   Temp 97.9 F (36.6 C) (Oral)   Resp 18   Ht 6\' 2"  (1.88 m)   Wt 81.6 kg   SpO2 94%   BMI 23.11 kg/m   Physical Exam Vitals and nursing note reviewed.  Constitutional:      General: He is not in acute distress.    Appearance: He is well-developed.  HENT:     Head: Normocephalic and atraumatic.  Eyes:  Conjunctiva/sclera: Conjunctivae normal.  Cardiovascular:     Rate and Rhythm: Normal rate and regular rhythm.     Heart sounds: No murmur heard. Pulmonary:     Effort: Pulmonary effort is normal. No respiratory distress.  Abdominal:     Comments: Small palpable nodule in the abdominal wall  Musculoskeletal:        General: No swelling.     Cervical back: Neck supple.  Skin:    General: Skin is warm and dry.  Neurological:     Mental Status: He is alert.  Psychiatric:        Mood and Affect: Mood normal.     ED Results and Treatments Labs (all labs ordered are listed, but only abnormal results are displayed) Labs Reviewed - No data to display                                                                                                                        Radiology US Abdomen Limited  Result Date: 09/03/2023 CLINICAL DATA:  Abdominal wall mass EXAM: ULTRASOUND ABDOMEN LIMITED COMPARISON:  None Available. FINDINGS: Focused ultrasound in the area of concern near the umbilicus demonstrates a subcutaneous hypoechoic heterogeneous area measuring 5 x 4 x 7 mm. No clear wall defect or fluid collection. No blood flow on Doppler. IMPRESSION: Nonspecific 7 mm subcutaneous hypoechoic heterogeneous masslike area without fluid collection or obvious hernia. Please correlate for  any interval change, growth or pain. Additional cross-sectional study could be considered such as CT scan or MRI with contrast to further delineate. As clinically appropriate Electronically Signed   By: Karen Kays M.D.   On: 09/03/2023 12:53    Pertinent labs & imaging results that were available during my care of the patient were reviewed by me and considered in my medical decision making (see MDM for details).  Medications Ordered in ED Medications - No data to display                                                                                                                                   Procedures Procedures  (including critical care time)  Medical Decision Making / ED Course   This patient presents to the ED for concern of abdominal mass, this involves an extensive number of treatment options, and is a complaint that carries with it a high risk of complications and morbidity.  The differential diagnosis includes  cyst, abscess, hernia, scar tissue, dermoid cyst  MDM: Patient seen emergency room for evaluation of an abdominal mass.  Physical exam with a very small palpable nodule to the left of the umbilicus with no surrounding erythema.  Soft tissue ultrasound performed showing a nonspecific 7 mm subcutaneous hypoechoic heterogenous masslike area without fluid collection or obvious hernia.  Patient is otherwise asymptomatic and has no additional symptoms, especially ones that would be associated with malignancy.  He has a primary care physician and review shared decision making to come up with a plan that patient will follow-up outpatient and have this serially examined to ensure that this is not growing.  Patient is hemodynamically stable and at this time does not meet inpatient criteria for admission.  Patient then discharged with outpatient follow-up and return precautions given which she voiced understanding.   Additional history obtained: -Additional history obtained from  partner -External records from outside source obtained and reviewed including: Chart review including previous notes, labs, imaging, consultation notes   Imaging Studies ordered: I ordered imaging studies including ultrasound abdomen I independently visualized and interpreted imaging. I agree with the radiologist interpretation   Medicines ordered and prescription drug management: No orders of the defined types were placed in this encounter.   -I have reviewed the patients home medicines and have made adjustments as needed  Critical interventions none   Cardiac Monitoring: The patient was maintained on a cardiac monitor.  I personally viewed and interpreted the cardiac monitored which showed an underlying rhythm of: NSR  Social Determinants of Health:  Factors impacting patients care include: none   Reevaluation: After the interventions noted above, I reevaluated the patient and found that they have :stayed the same  Co morbidities that complicate the patient evaluation  Past Medical History:  Diagnosis Date   Depression       Dispostion: I considered admission for this patient, but at this time he does not meet inpatient criteria for admission he is safe for discharge with outpatient follow-up     Final Clinical Impression(s) / ED Diagnoses Final diagnoses:  Abdominal wall mass     @PCDICTATION @    Cayce Paschal, Wyn Forster, MD 09/03/23 1719

## 2023-09-09 ENCOUNTER — Ambulatory Visit: Payer: Self-pay | Admitting: *Deleted

## 2023-09-09 NOTE — Telephone Encounter (Signed)
Message from Phill Myron sent at 09/09/2023  2:56 PM EDT  Summary: spider bite advice   Spider bite,  made an appt for tomorrow but wanted advice still.          Call History  Contact Date/Time Type Contact Phone/Fax User  09/09/2023 02:55 PM EDT Phone (Incoming) Manuel Curtis, Manuel Curtis (Self) [phone] Awilda Bill E   Reason for Disposition  [1] Red or very tender (to touch) area AND [2] started over 24 hours after the bite  Answer Assessment - Initial Assessment Questions 1. TYPE of SPIDER: "What type of spider was it?"  (e.g., name, unknown, or brief description)     I think I was bitten by a spider but I'm not sure.    It's like 2 heads on it.   It started out small but it's gotten bigger.   I'm feeling nauseas too.  It's not draining or bleeding. 2. LOCATION: "Where is the bite located?"      On my left side of my face near my temple. 3. PAIN: "Is there any pain?" If Yes, ask: "How bad is it?"  (Scale 1-10; or mild, moderate, severe)    - NONE (0): no pain    - MILD (1-3): doesn't interfere with normal activities     - MODERATE (4-7): interferes with normal activities or awakens from sleep     - SEVERE (8-10): excruciating pain, unable to do any normal activities     It's a little sore.    It happened before last night.  I starting feeling it yesterday when I got home from work. 4. SWELLING: "How big is the swelling?" (Inches, cm or compare to coins)      A little.   It's gotten bigger. 5. ONSET: "When did the bite occur?" (Minutes or hours ago)      I felt it a little yesterday when I got home from work. 6. TETANUS: "When was the last tetanus booster?"      Not asked 7. OTHER SYMPTOMS: "Do you have any other symptoms?"  (e.g., muscle cramps, abdomen pain, change in urine color)     I'm feeling nauseas.  Protocols used: Spider Bite - [employer]

## 2023-09-09 NOTE — Telephone Encounter (Signed)
  Chief Complaint: Insect bite on side of his face near his temple Symptoms: Red and swollen and getting bigger and sore.  Not draining or bleeding.   Has 2 heads on it. Frequency: He felt it coming on when he got home from work yesterday. Pertinent Negatives: Patient denies Knowing what bit him.   Thinks it could have been a spider but doesn't know. Disposition: [] ED /[] Urgent Care (no appt availability in office) / [x] Appointment(In office/virtual)/ []  [facility]/ [] Home Care/ [] Refused Recommended Disposition /[]  Mobile Bus/ []  Follow-up with PCP Additional Notes: Agent has made him an appt with Dr. Ashley Royalty for 09/10/2023 at 8:00. I instructed him not to scratch or touch it.   If it starts to drain to put a band aid over it.   He can also use some hydrocortisone cream too.

## 2023-09-10 ENCOUNTER — Encounter: Payer: Self-pay | Admitting: Family Medicine

## 2023-09-10 ENCOUNTER — Ambulatory Visit (INDEPENDENT_AMBULATORY_CARE_PROVIDER_SITE_OTHER): Payer: Self-pay | Admitting: Family Medicine

## 2023-09-10 VITALS — BP 110/78 | HR 78 | Ht 73.0 in | Wt 175.0 lb

## 2023-09-10 DIAGNOSIS — L738 Other specified follicular disorders: Secondary | ICD-10-CM | POA: Insufficient documentation

## 2023-09-10 MED ORDER — MUPIROCIN 2 % EX OINT: TOPICAL_OINTMENT | CUTANEOUS | 1 refills | Status: AC

## 2023-09-10 NOTE — Assessment & Plan Note (Signed)
This is chronic issue with recent recurrence over the past 2-3 days, localized to the left TMJ region at the hair line, punctate papulopustular 1x1 cm region that is most consistent with folliculitis barbae.  We reviewed treatment strategies given the chronicity to tackle this episode and future episodes.  - Topical Bactroban Rx TID x 7 days - Lifestyle changes - Return PRN

## 2023-09-10 NOTE — Progress Notes (Signed)
     Primary Care / Sports Medicine Office Visit  Patient Information:  Patient ID: WYETH GOUDREAU, male DOB: [birth year] Age: 37 y.o. MRN: [id number]   TEMESGEN JUEDES is a pleasant 37 y.o. male presenting with the following:  Chief Complaint  Patient presents with   Cyst    2 days left side of face, unknown insect or ingrown hair    Vitals:   09/10/23 0806  BP: 110/78  Pulse: 78  SpO2: 100%   Vitals:   09/10/23 0806  Weight: 175 lb (79.4 kg)  Height: 6\' 1"  (1.854 m)   Body mass index is 23.09 kg/m.  US Abdomen Limited  Result Date: 09/03/2023 CLINICAL DATA:  Abdominal wall mass EXAM: ULTRASOUND ABDOMEN LIMITED COMPARISON:  None Available. FINDINGS: Focused ultrasound in the area of concern near the umbilicus demonstrates a subcutaneous hypoechoic heterogeneous area measuring 5 x 4 x 7 mm. No clear wall defect or fluid collection. No blood flow on Doppler. IMPRESSION: Nonspecific 7 mm subcutaneous hypoechoic heterogeneous masslike area without fluid collection or obvious hernia. Please correlate for any interval change, growth or pain. Additional cross-sectional study could be considered such as CT scan or MRI with contrast to further delineate. As clinically appropriate Electronically Signed   By: Karen Kays M.D.   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  We reviewed treatment strategies given the chronicity to tackle this episode and future episodes.  - Topical Bactroban Rx TID x 7 days - Lifestyle changes -  Return PRN      Relevant Medications   mupirocin ointment (BACTROBAN) 2 %     Orders & Medications Medications:  Meds ordered this encounter  Medications   mupirocin ointment (BACTROBAN) 2 %    Sig: Apply to affected area TID for 7 days.    Dispense:  30 g    Refill:  1   No orders of the defined types were placed in this encounter.    No follow-ups on file.     Jerrol Banana, MD, [facility]   Primary Care Sports Medicine Primary Care and Sports Medicine at [facility]

## 2023-09-10 NOTE — Patient Instructions (Signed)
-   Use topical antibiotic 3 times daily to clean and dry skin x 7 days - Review information attached - Return for physical once scheduled

## 2023-12-17 ENCOUNTER — Encounter: Payer: Self-pay | Admitting: Family Medicine
